# Patient Record
Sex: Male | Born: 1940 | Hispanic: No | Smoking: Never smoker
Health system: Southern US, Community
[De-identification: ages and names within clinical notes are randomized; demographics above are authoritative.]

## PROBLEM LIST (undated history)

## (undated) DIAGNOSIS — F0390 Unspecified dementia without behavioral disturbance: Secondary | ICD-10-CM

---

## 2011-04-10 ENCOUNTER — Other Ambulatory Visit: Payer: Self-pay | Admitting: Family Medicine

## 2011-04-10 DIAGNOSIS — G3184 Mild cognitive impairment, so stated: Secondary | ICD-10-CM

## 2012-08-05 ENCOUNTER — Other Ambulatory Visit: Payer: Self-pay | Admitting: Family Medicine

## 2012-08-05 DIAGNOSIS — F17200 Nicotine dependence, unspecified, uncomplicated: Secondary | ICD-10-CM

## 2012-08-17 ENCOUNTER — Other Ambulatory Visit: Payer: Self-pay | Admitting: Family Medicine

## 2012-08-17 DIAGNOSIS — R9389 Abnormal findings on diagnostic imaging of other specified body structures: Secondary | ICD-10-CM

## 2012-08-24 ENCOUNTER — Ambulatory Visit
Admission: RE | Admit: 2012-08-24 | Discharge: 2012-08-24 | Disposition: A | Discharge status: Home / Self Care | Payer: Medicare Other | Source: Ambulatory Visit | Attending: Family Medicine | Admitting: Family Medicine

## 2012-08-24 DIAGNOSIS — R9389 Abnormal findings on diagnostic imaging of other specified body structures: Secondary | ICD-10-CM

## 2014-05-26 ENCOUNTER — Other Ambulatory Visit: Payer: Self-pay | Admitting: Family Medicine

## 2014-05-26 DIAGNOSIS — G3184 Mild cognitive impairment, so stated: Secondary | ICD-10-CM

## 2014-06-03 ENCOUNTER — Ambulatory Visit
Admission: RE | Admit: 2014-06-03 | Discharge: 2014-06-03 | Disposition: A | Discharge status: Home / Self Care | Payer: Medicare Other | Source: Ambulatory Visit | Attending: Family Medicine | Admitting: Family Medicine

## 2014-06-03 DIAGNOSIS — G3184 Mild cognitive impairment, so stated: Secondary | ICD-10-CM

## 2016-05-27 ENCOUNTER — Emergency Department (HOSPITAL_COMMUNITY): Payer: Medicare Other

## 2016-05-27 ENCOUNTER — Encounter (HOSPITAL_COMMUNITY): Payer: Self-pay

## 2016-05-27 ENCOUNTER — Emergency Department (HOSPITAL_COMMUNITY)
Admission: EM | Admit: 2016-05-27 | Discharge: 2016-05-27 | Disposition: A | Discharge status: Home / Self Care | Payer: Medicare Other | Attending: Emergency Medicine | Admitting: Emergency Medicine

## 2016-05-27 DIAGNOSIS — D72829 Elevated white blood cell count, unspecified: Secondary | ICD-10-CM | POA: Diagnosis not present

## 2016-05-27 DIAGNOSIS — R42 Dizziness and giddiness: Secondary | ICD-10-CM | POA: Diagnosis present

## 2016-05-27 DIAGNOSIS — Z79899 Other long term (current) drug therapy: Secondary | ICD-10-CM | POA: Diagnosis not present

## 2016-05-27 HISTORY — DX: Unspecified dementia, unspecified severity, without behavioral disturbance, psychotic disturbance, mood disturbance, and anxiety: F03.90

## 2016-05-27 LAB — CBC WITH DIFFERENTIAL/PLATELET
BASOS ABS: 0 10*3/uL (ref 0.0–0.1)
BASOS PCT: 0 %
EOS ABS: 0 10*3/uL (ref 0.0–0.7)
Eosinophils Relative: 0 %
HCT: 46.3 % (ref 39.0–52.0)
Hemoglobin: 15.4 g/dL (ref 13.0–17.0)
Lymphocytes Relative: 8 %
Lymphs Abs: 1.7 10*3/uL (ref 0.7–4.0)
MCH: 30.9 pg (ref 26.0–34.0)
MCHC: 33.3 g/dL (ref 30.0–36.0)
MCV: 93 fL (ref 78.0–100.0)
MONO ABS: 1.9 10*3/uL — AB (ref 0.1–1.0)
MONOS PCT: 8 %
NEUTROS ABS: 19.1 10*3/uL — AB (ref 1.7–7.7)
Neutrophils Relative %: 84 %
PLATELETS: 284 10*3/uL (ref 150–400)
RBC: 4.98 MIL/uL (ref 4.22–5.81)
RDW: 12.6 % (ref 11.5–15.5)
WBC: 22.6 10*3/uL — ABNORMAL HIGH (ref 4.0–10.5)

## 2016-05-27 LAB — COMPREHENSIVE METABOLIC PANEL
ALBUMIN: 4 g/dL (ref 3.5–5.0)
ALT: 13 U/L — ABNORMAL LOW (ref 17–63)
ANION GAP: 8 (ref 5–15)
AST: 18 U/L (ref 15–41)
Alkaline Phosphatase: 58 U/L (ref 38–126)
BILIRUBIN TOTAL: 1.2 mg/dL (ref 0.3–1.2)
BUN: 19 mg/dL (ref 6–20)
CHLORIDE: 105 mmol/L (ref 101–111)
CO2: 25 mmol/L (ref 22–32)
Calcium: 9 mg/dL (ref 8.9–10.3)
Creatinine, Ser: 0.83 mg/dL (ref 0.61–1.24)
GFR calc Af Amer: >60 mL/min (ref >60)
GFR calc non Af Amer: >60 mL/min (ref >60)
GLUCOSE: 126 mg/dL — AB (ref 65–99)
POTASSIUM: 3.7 mmol/L (ref 3.5–5.1)
SODIUM: 138 mmol/L (ref 135–145)
TOTAL PROTEIN: 6.8 g/dL (ref 6.5–8.1)

## 2016-05-27 LAB — I-STAT CG4 LACTIC ACID, ED: Lactic Acid, Venous: 1.03 mmol/L (ref 0.5–1.9)

## 2016-05-27 NOTE — Discharge Instructions (Signed)
Leukocytosis Leukocytosis means you have more white blood cells than normal. White blood cells are made in your bone marrow. The main job of white blood cells is to fight infection. Having too many white blood cells is a common condition. It can develop as a result of many types of medical problems. CAUSES  In some cases, your bone marrow may be normal, but it is still making too many white blood cells. This could be the result of:  Infection.  Injury.  Physical stress.  Emotional stress.  Surgery.  Allergic reactions.  Tumors that do not start in the blood or bone marrow.  An inherited disease.  Certain medicines.  Pregnancy and labor. In other cases, you may have a bone marrow disorder that is causing your body to make too many white blood cells. Bone marrow disorders include:  Leukemia. This is a type of blood cancer.  Myeloproliferative disorders. These disorders cause blood cells to grow abnormally. SYMPTOMS  Some people have no symptoms. Others have symptoms due to the medical problem that is causing their leukocytosis. These symptoms may include:  Bleeding.  Bruising.  Fever.  Night sweats.  Repeated infections.  Weakness.  Weight loss. DIAGNOSIS  Leukocytosis is often found during blood tests that are done as part of a normal physical exam. Your caregiver will probably order other tests to help determine why you have too many white blood cells. These tests may include:  A complete blood count (CBC). This test measures all the types of blood cells in your body.  Chest X-rays, urine tests (urinalysis), or other tests to look for signs of infection.  Bone marrow aspiration. For this test, a needle is put into your bone. Cells from the bone marrow are removed through the needle. The cells are then examined under a microscope. TREATMENT  Treatment is usually not needed for leukocytosis. However, if a disorder is causing your leukocytosis, it will need to be  treated. Treatment may include:  Antibiotic medicines if you have a bacterial infection.  Bone marrow transplant. Your diseased bone marrow is replaced with healthy cells that will grow new bone marrow.  Chemotherapy. This is the use of drugs to kill cancer cells. HOME CARE INSTRUCTIONS  Only take over-the-counter or prescription medicines as directed by your caregiver.  Maintain a healthy weight. Ask your caregiver what weight is best for you.  Eat foods that are low in saturated fats and high in fiber. Eat plenty of fruits and vegetables.  Drink enough fluids to keep your urine clear or pale yellow.  Get 30 minutes of exercise at least 5 times a week. Check with your caregiver before starting a new exercise routine.  Limit caffeine and alcohol.  Do not smoke.  Keep all follow-up appointments as directed by your caregiver. SEEK MEDICAL CARE IF:  You feel weak or more tired than usual.  You develop chills, a cough, or nasal congestion.  You lose weight without trying.  You have night sweats.  You bruise easily. SEEK IMMEDIATE MEDICAL CARE IF:  You bleed more than normal.  You have chest pain.  You have trouble breathing.  You have a fever.  You have uncontrolled nausea or vomiting.  You feel dizzy or lightheaded. MAKE SURE YOU:  Understand these instructions.  Will watch your condition.  Will get help right away if you are not doing well or get worse.   This information is not intended to replace advice given to you by your health care provider.   Make sure you discuss any questions you have with your health care provider.   Document Released: 10/23/2011 Document Revised: 01/26/2012 Document Reviewed: 05/07/2015 Elsevier Interactive Patient Education 2016 Elsevier Inc.  

## 2016-05-27 NOTE — ED Notes (Signed)
Patient transported to CT 

## 2016-05-27 NOTE — Progress Notes (Signed)
Patient confirms his pcp is Dr. Sigmund HazelLisa Miller.  System updated.

## 2016-05-27 NOTE — ED Notes (Signed)
Pt sent from doctor to be evaluated for abnormal lab work. His WBC were elevated. Pt complains of dizziness, fatigue, decreased appetite

## 2016-05-27 NOTE — ED Notes (Addendum)
Patient transported to x-ray. ?

## 2016-05-27 NOTE — ED Notes (Signed)
Bed: [id number] Expected date:  Expected time:  Means of arrival:  Comments: Tr 8

## 2016-05-27 NOTE — ED Provider Notes (Signed)
CSN: [id number]     Arrival date & time 05/27/16  1937 History  By signing my name below, I, Jeremy Olson, attest that this documentation has been prepared under the direction and in the presence of TRW AutomotiveKelly Mikya Don, PA-C. Electronically Signed: Bridgette HabermannMaria Olson, ED Scribe. 05/27/2016. 9:24 PM.   Chief Complaint  Patient presents with  . Abnormal Lab   The history is provided by the patient and the spouse. No language interpreter was used.   HPI Comments: Jeremy AdamJames B Jeremy Olson Jr. is a 75 y.o. male with h/o vascular dementia who presents to the Emergency Department complaining of dizziness onset this morning. Pt also had associated fatigue and decreased appetite. Per pt's wife, the symptoms lasted all day, but have spontaneously improved. He took an Aleve which made him sleep for most of the day. [facility], where they ran some labs on him and his WBC were elevated so they sent him here. Wife states he has not eaten anything today. Pt has no h/o MI or CVA. Pt has a [facility]. Pt denies headache, fever, chest pain, abdominal pain, nausea, vomiting, unilateral weakness or numbness, and slurred speech. Pt states his symptoms have resolved since arriving to the ED.   Past Medical History  Diagnosis Date  . Dementia    History reviewed. No pertinent past surgical history. History reviewed. No pertinent family history. Social History  Substance Use Topics  . Smoking status: Never Smoker   . Smokeless tobacco: None  . Alcohol Use: No    Review of Systems  Constitutional: Positive for appetite change and fatigue. Negative for fever.  Cardiovascular: Negative for chest pain.  Gastrointestinal: Negative for nausea, vomiting and abdominal pain.  Neurological: Positive for dizziness. Negative for speech difficulty, weakness and headaches.    Allergies  Review of patient's allergies indicates no known allergies.  Home Medications   Prior to Admission  medications   Medication Sig Start Date End Date Taking? Authorizing Provider  donepezil (ARICEPT) 10 MG tablet Take 10 mg by mouth at bedtime.   Yes Historical Provider, MD   BP 117/68 mmHg  Pulse 63  Temp(Src) 99.2 F (37.3 C) (Oral)  Resp 16  Ht 6\' 3"  (1.905 m)  Wt 141 lb (63.957 kg)  BMI 17.62 kg/m2  SpO2 98%   Physical Exam  Constitutional: He is oriented to person, place, and time. He appears well-developed and well-nourished. No distress.  Nontoxic appearing and in no distress  HENT:  Head: Normocephalic and atraumatic.  Mouth/Throat: Oropharynx is clear and moist. No oropharyngeal exudate.  Symmetric rise of the uvula with phonation  Eyes: Conjunctivae and EOM are normal. Pupils are equal, round, and reactive to light. No scleral icterus.  Neck: Normal range of motion.  Cardiovascular: Normal rate, regular rhythm and intact distal pulses.   Pulmonary/Chest: Effort normal and breath sounds normal. No respiratory distress. He has no wheezes. He has no rales.  Lungs clear to auscultation bilaterally. Chest expansion symmetric.  Musculoskeletal: Normal range of motion.  Neurological: He is alert and oriented to person, place, and time. No cranial nerve deficit. He exhibits normal muscle tone. Coordination normal.  GCS 15. Speech is goal oriented. No cranial nerve deficits appreciated; symmetric eyebrow raise, no facial drooping, tongue midline. Patient is equal grip strength bilaterally with 5/5 strength against resistance in all major muscle groups bilaterally. Sensation to light touch intact. Patient moves extremities without ataxia. No pronator drift. He is  ambulatory independently with steady gait.  Skin: Skin is warm and dry. No rash noted. He is not diaphoretic. No erythema. No pallor.  Psychiatric: He has a normal mood and affect. His behavior is normal.  Nursing note and vitals reviewed.   ED Course  Procedures  DIAGNOSTIC STUDIES: Oxygen Saturation is 97% on RA,  adequate by my interpretation.    COORDINATION OF CARE: 9:24 PM Discussed treatment plan with pt at bedside which includes head CT and blood work and pt agreed to plan.  Labs Review Labs Reviewed  CBC WITH DIFFERENTIAL/PLATELET - Abnormal; Notable for the following:    WBC 22.6 (*)    Neutro Abs 19.1 (*)    Monocytes Absolute 1.9 (*)    All other components within normal limits  COMPREHENSIVE METABOLIC PANEL - Abnormal; Notable for the following:    Glucose, Bld 126 (*)    ALT 13 (*)    All other components within normal limits  I-STAT CG4 LACTIC ACID, ED    Imaging Review Dg Chest 2 View  05/27/2016  CLINICAL DATA:  Dizziness, leukocytosis.  Fatigue EXAM: CHEST  2 VIEW COMPARISON:  CT chest August 24, 2012 FINDINGS: Biapical scarring with hilar retraction suggests old granulomatous disease. No pleural effusion or focal consolidation. Cardiac silhouette is normal. No pneumothorax. Soft tissue planes and included osseous structures are nonsuspicious, mild degenerative change of thoracic spine. IMPRESSION: No acute cardiopulmonary process. Electronically Signed   By: Awilda Metro M.D.   On: 05/27/2016 22:05     I have personally reviewed and evaluated these images and lab results as part of my medical decision-making.   EKG Interpretation None      MDM   Final diagnoses:  Leukocytosis    75 year old male presents to the emergency department for evaluation of leukocytosis. He went to his primary care doctor's office this morning for complaints of dizziness, decreased appetite, and fatigue. Basic blood work was run which was significant for a leukocytosis of 24.8. The patient states that he feels fine. He states that he does not want to be here and that it is silly for him to be in the emergency department. He has a reassuring physical exam. No complaints of chest pain, shortness of breath, abdominal pain, or headache. Patient is afebrile.  Leukocytosis confirmed at 22.6.  Remainder laboratory workup is noncontributory. Patient had a urinalysis completed in the office which was negative for infection. Chest x-ray negative for pneumonia. Head CT also performed given complaints of dizziness and wife reporting some unsteady gait. CT negative for acute stroke. No hemorrhage, mass, or midline shift. Neurologic exam in the emergency department today is nonfocal.  Patient has been monitored for approximately 4 hours. He has had no decompensation in his mental status or any worsening of his gait. He states that he is no longer dizzy and he wants to go home. I do not see any indication for admission today. We have recommended close outpatient follow-up with his primary care doctor with return if symptoms worsen. Return precautions given at discharge. Patient and wife agreeable to plan with no unaddressed concerns; discharged in satisfactory condition.  I personally performed the services described in this documentation, which was scribed in my presence. The recorded information has been reviewed and is accurate.    Filed Vitals:   05/27/16 1939 05/27/16 2214 05/27/16 2331  BP: 158/134 117/68 102/59  Pulse: 93 63 71  Temp: 99.2 F (37.3 C)  98.5 F (36.9 C)  TempSrc: Oral  Oral  Resp:  16 17  Height:  6\' 3"  (1.905 m)   Weight:  63.957 kg   SpO2: 97% 98% 94%      Antony Madura, PA-C 05/28/16 1956  Benjiman Core, MD 05/29/16 [phone]

## 2018-05-17 IMAGING — CT CT HEAD W/O CM
3 of 4 series · 16 of 47 positions shown, 19 images · non-contrast
Comparison: MR brain 06/03/2014

CLINICAL DATA: Dizziness, altered mental status

EXAM:
CT HEAD WITHOUT CONTRAST
TECHNIQUE: Contiguous axial images were obtained from the base of the skull
through the vertex without intravenous contrast.

[Series 2: head w/o · axial · non-contrast · 0.42mm/px · z∈[-102,+18]mm · 10 of 30 slices shown, 13 images]
[im 3/30  brain]
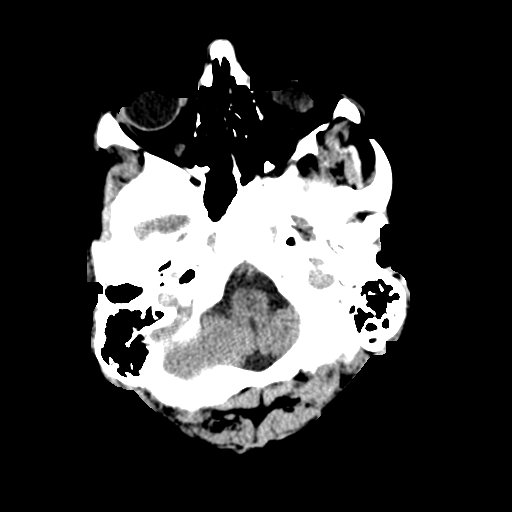
[im 3/30  bone]
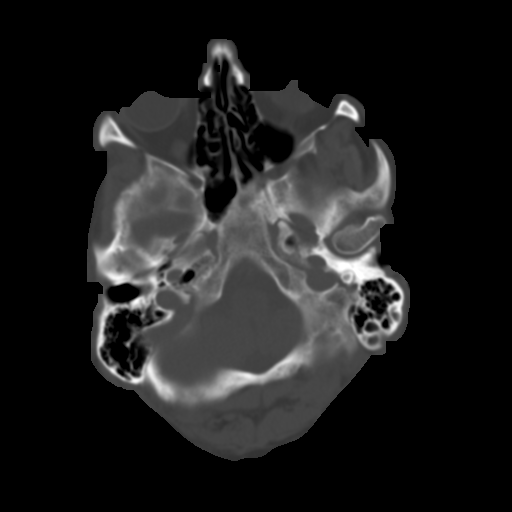
[im 5/30  brain]
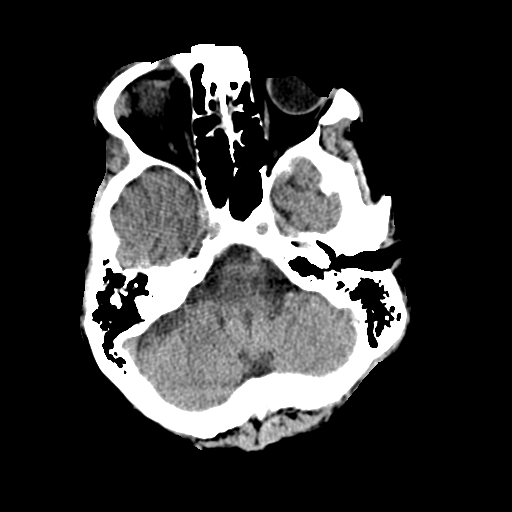
[im 9/30  brain]
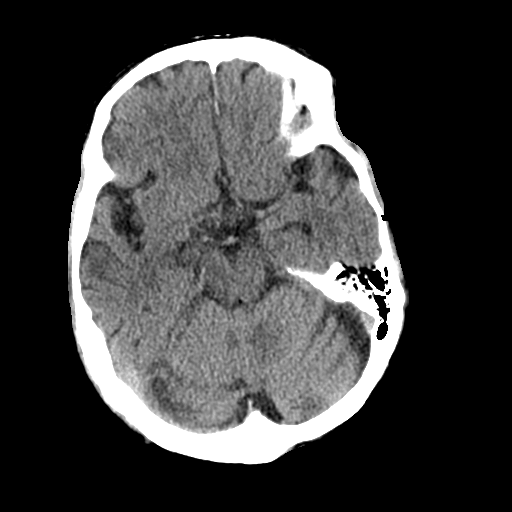
[im 11/30  brain]
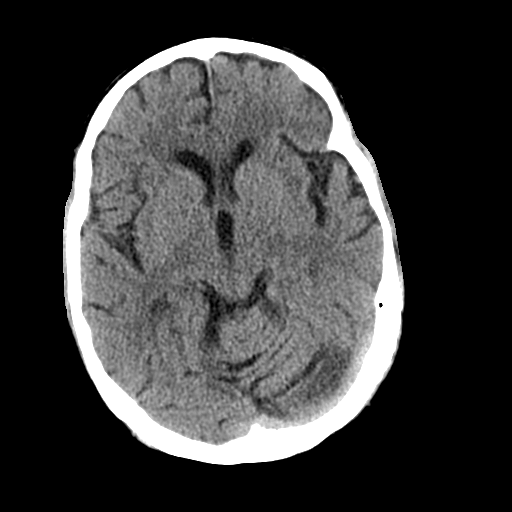
[im 13/30  brain]
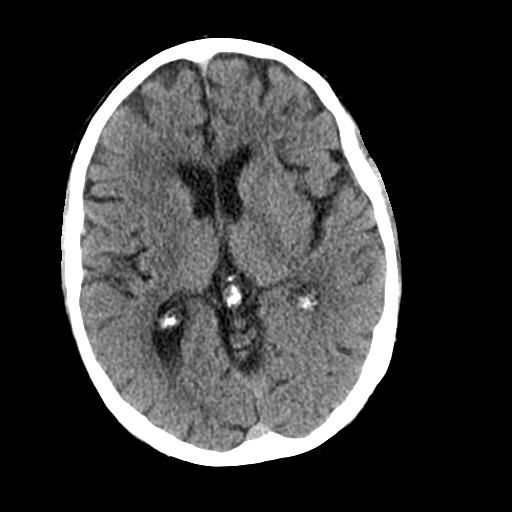
[im 13/30  bone]
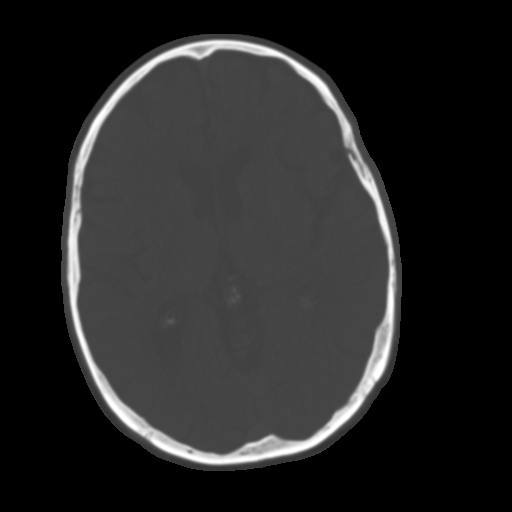
[im 17/30  brain]
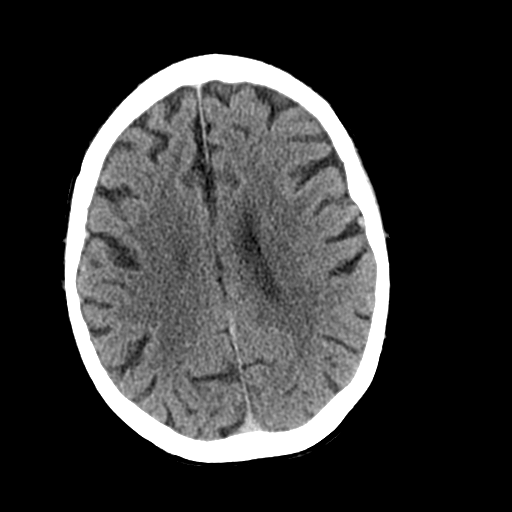
[im 19/30  brain]
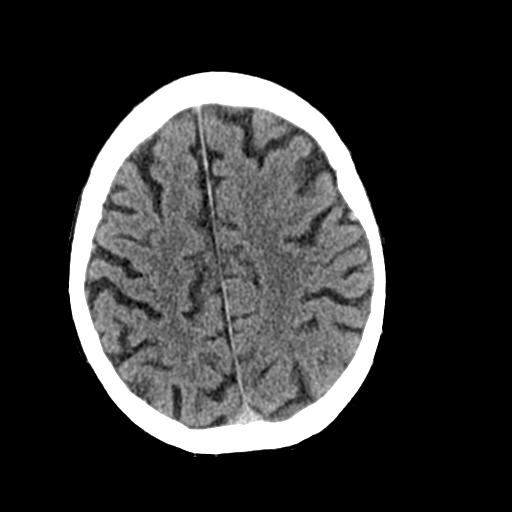
[im 21/30  brain]
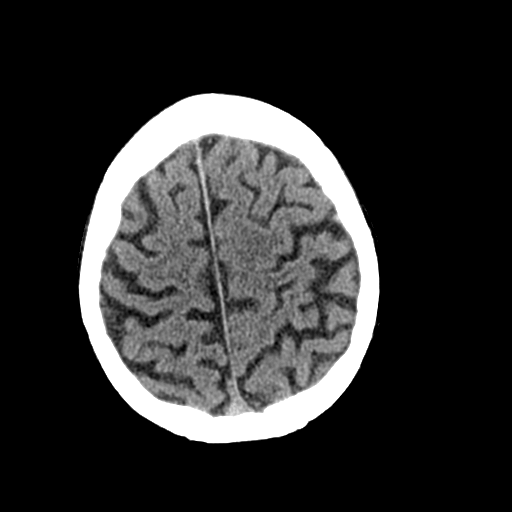
[im 25/30  brain]
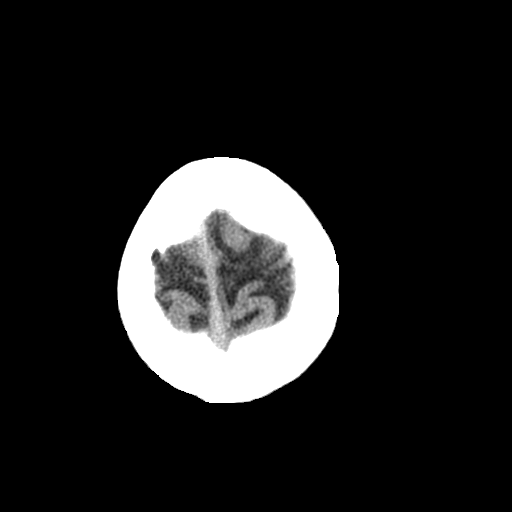
[im 25/30  bone]
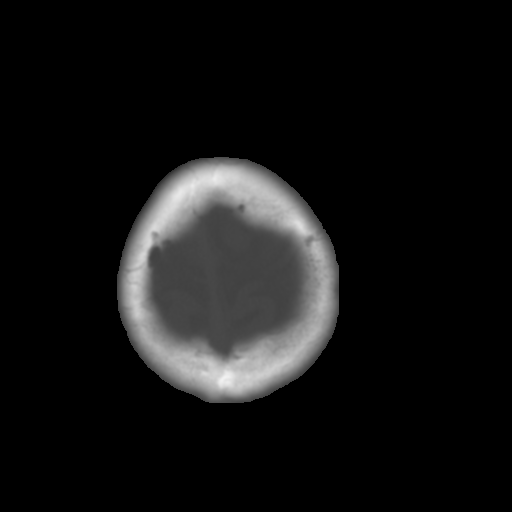
[im 27/30  brain]
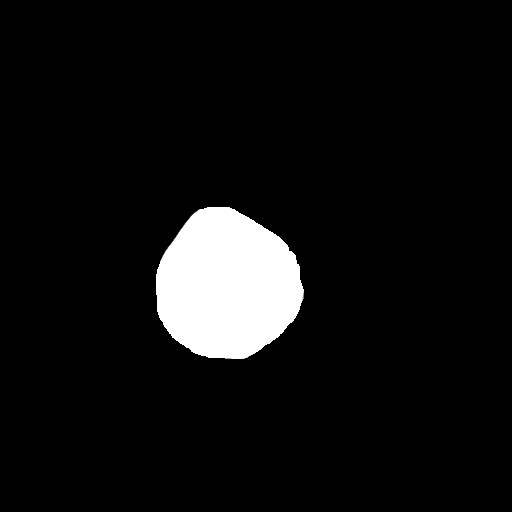

[Series 5: coronal · coronal · 0.29mm/px · 3 of 61 slices shown]
[im 21/61  brain]
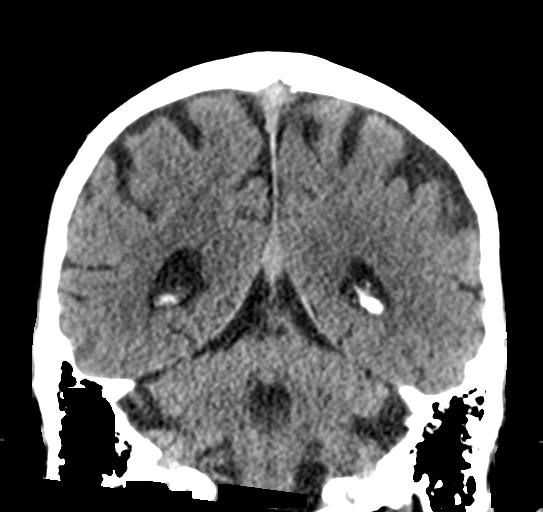
[im 27/61  brain]
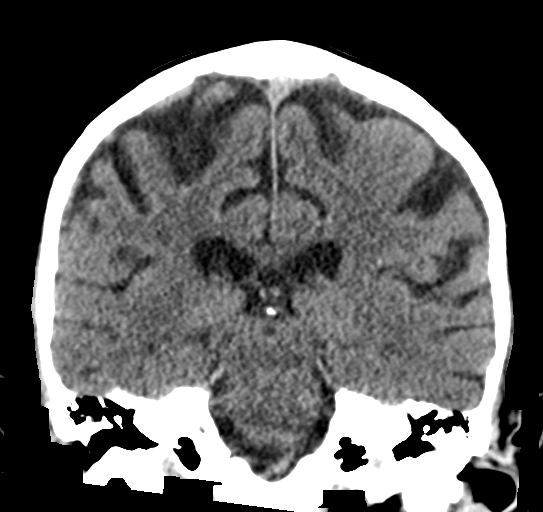
[im 34/61  brain]
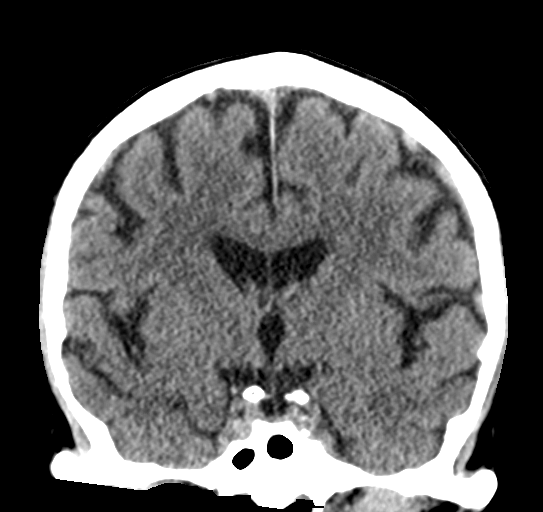

[Series 6: sagittal · sagittal · 0.29mm/px · 3 of 49 slices shown]
[im 17/49  brain]
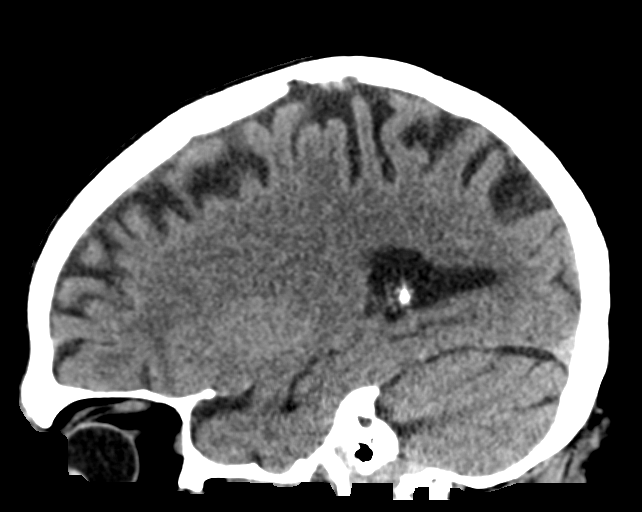
[im 25/49  brain]
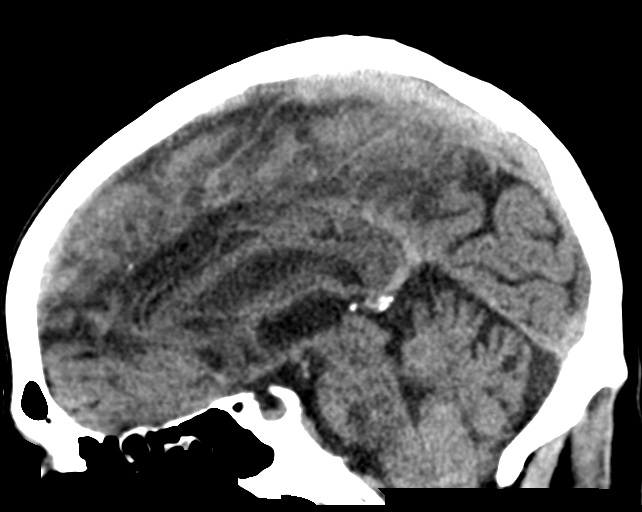
[im 33/49  brain]
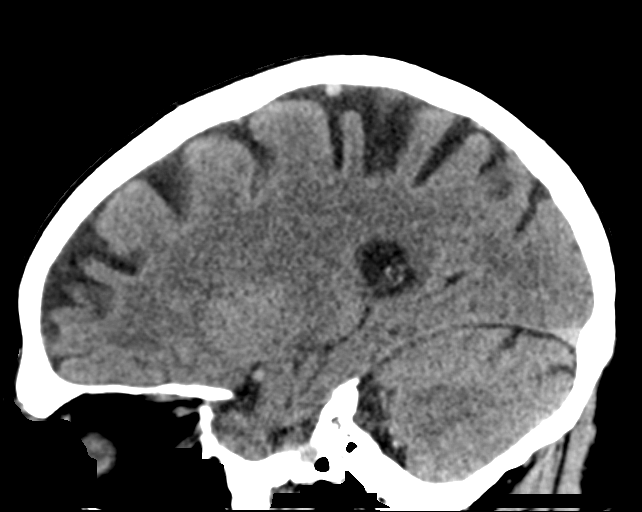

[16 of 47 positions shown; findings below may reference images not displayed]

FINDINGS: There is no evidence of mass effect, midline shift, or extra-axial
fluid collections. There is no evidence of a space-occupying lesion
or intracranial hemorrhage. There is no evidence of a cortical-based
area of acute infarction. There is generalized cerebral atrophy.
There is periventricular white matter low attenuation likely
secondary to microangiopathy.

The ventricles and sulci are appropriate for the patient's age. The
basal cisterns are patent.

Visualized portions of the orbits are unremarkable. The visualized
portions of the paranasal sinuses and mastoid air cells are
unremarkable.

The osseous structures are unremarkable.
IMPRESSION: No acute intracranial pathology.

## 2021-05-28 DIAGNOSIS — Z681 Body mass index (BMI) 19 or less, adult: Secondary | ICD-10-CM | POA: Diagnosis not present

## 2021-05-28 DIAGNOSIS — R636 Underweight: Secondary | ICD-10-CM | POA: Diagnosis not present

## 2021-05-28 DIAGNOSIS — F17210 Nicotine dependence, cigarettes, uncomplicated: Secondary | ICD-10-CM | POA: Diagnosis not present

## 2022-06-25 DIAGNOSIS — Z0000 Encounter for general adult medical examination without abnormal findings: Secondary | ICD-10-CM | POA: Diagnosis not present

## 2022-06-25 DIAGNOSIS — Z23 Encounter for immunization: Secondary | ICD-10-CM | POA: Diagnosis not present

## 2022-06-25 DIAGNOSIS — F17210 Nicotine dependence, cigarettes, uncomplicated: Secondary | ICD-10-CM | POA: Diagnosis not present

## 2022-11-01 DIAGNOSIS — R6889 Other general symptoms and signs: Secondary | ICD-10-CM | POA: Diagnosis not present

## 2022-11-04 DIAGNOSIS — R350 Frequency of micturition: Secondary | ICD-10-CM | POA: Diagnosis not present

## 2022-11-06 DIAGNOSIS — R350 Frequency of micturition: Secondary | ICD-10-CM | POA: Diagnosis not present

## 2022-11-07 ENCOUNTER — Other Ambulatory Visit: Payer: Self-pay

## 2022-11-07 ENCOUNTER — Emergency Department (HOSPITAL_COMMUNITY)
Admission: EM | Admit: 2022-11-07 | Discharge: 2022-11-09 | Disposition: A | Discharge status: Home / Self Care | Payer: Medicare Other | Attending: Emergency Medicine | Admitting: Emergency Medicine

## 2022-11-07 ENCOUNTER — Encounter (HOSPITAL_COMMUNITY): Payer: Self-pay

## 2022-11-07 DIAGNOSIS — F03918 Unspecified dementia, unspecified severity, with other behavioral disturbance: Secondary | ICD-10-CM | POA: Insufficient documentation

## 2022-11-07 DIAGNOSIS — R2689 Other abnormalities of gait and mobility: Secondary | ICD-10-CM | POA: Insufficient documentation

## 2022-11-07 DIAGNOSIS — R4689 Other symptoms and signs involving appearance and behavior: Secondary | ICD-10-CM | POA: Diagnosis present

## 2022-11-07 DIAGNOSIS — R799 Abnormal finding of blood chemistry, unspecified: Secondary | ICD-10-CM | POA: Insufficient documentation

## 2022-11-07 DIAGNOSIS — R9431 Abnormal electrocardiogram [ECG] [EKG]: Secondary | ICD-10-CM | POA: Diagnosis not present

## 2022-11-07 LAB — BASIC METABOLIC PANEL
Anion gap: 9 (ref 5–15)
BUN: 20 mg/dL (ref 8–23)
CO2: 25 mmol/L (ref 22–32)
Calcium: 9.4 mg/dL (ref 8.9–10.3)
Chloride: 106 mmol/L (ref 98–111)
Creatinine, Ser: 0.8 mg/dL (ref 0.61–1.24)
GFR, Estimated: >60 mL/min (ref >60)
Glucose, Bld: 133 mg/dL — ABNORMAL HIGH (ref 70–99)
Potassium: 3.6 mmol/L (ref 3.5–5.1)
Sodium: 140 mmol/L (ref 135–145)

## 2022-11-07 LAB — URINALYSIS, ROUTINE W REFLEX MICROSCOPIC
Bilirubin Urine: NEGATIVE
Glucose, UA: NEGATIVE mg/dL
Ketones, ur: NEGATIVE mg/dL
Leukocytes,Ua: NEGATIVE
Nitrite: NEGATIVE
Protein, ur: NEGATIVE mg/dL
Specific Gravity, Urine: >1.03 — ABNORMAL HIGH (ref 1.005–1.030)
pH: 6 (ref 5.0–8.0)

## 2022-11-07 LAB — CBG MONITORING, ED: Glucose-Capillary: 148 mg/dL — ABNORMAL HIGH (ref 70–99)

## 2022-11-07 LAB — CBC WITH DIFFERENTIAL/PLATELET
Abs Immature Granulocytes: 0.02 10*3/uL (ref 0.00–0.07)
Basophils Absolute: 0.1 10*3/uL (ref 0.0–0.1)
Basophils Relative: 1 %
Eosinophils Absolute: 0.2 10*3/uL (ref 0.0–0.5)
Eosinophils Relative: 2 %
HCT: 44.4 % (ref 39.0–52.0)
Hemoglobin: 14.6 g/dL (ref 13.0–17.0)
Immature Granulocytes: 0 %
Lymphocytes Relative: 12 %
Lymphs Abs: 1 10*3/uL (ref 0.7–4.0)
MCH: 31.3 pg (ref 26.0–34.0)
MCHC: 32.9 g/dL (ref 30.0–36.0)
MCV: 95.3 fL (ref 80.0–100.0)
Monocytes Absolute: 0.7 10*3/uL (ref 0.1–1.0)
Monocytes Relative: 9 %
Neutro Abs: 6.4 10*3/uL (ref 1.7–7.7)
Neutrophils Relative %: 76 %
Platelets: UNDETERMINED 10*3/uL (ref 150–400)
RBC: 4.66 MIL/uL (ref 4.22–5.81)
RDW: 12.7 % (ref 11.5–15.5)
WBC: 8.4 10*3/uL (ref 4.0–10.5)
nRBC: 0 % (ref 0.0–0.2)

## 2022-11-07 LAB — URINALYSIS, MICROSCOPIC (REFLEX)
Bacteria, UA: NONE SEEN
Squamous Epithelial / LPF: NONE SEEN (ref 0–5)

## 2022-11-07 NOTE — ED Provider Triage Note (Signed)
Emergency Medicine Provider Triage Evaluation Note  Jeremy Olson. , a 81 y.o. male  was evaluated in triage.  Patient was brought in by his wife and her sister with concerns that patient needs placement in a long-term care facility and also with concerns of a UTI.  Patient has a longstanding history of dementia, but per sister-in-law patient has been worsening over the last 4 weeks and needs to be placed in a facility because she does not feel that her sister can take care of him anymore at home.  Patient has no subjective complaints.  Review of Systems  Positive: As above Negative: As above  Physical Exam  BP (!) 140/65 (BP Location: Right Arm)   Pulse 78   Temp 98.1 F (36.7 C) (Oral)   Resp 16   SpO2 99%  Gen:   Awake, no distress, A&Ox2, not oriented to time Resp:  Normal effort  MSK:   Moves extremities without difficulty  Other:    Medical Decision Making  Medically screening exam initiated at 1:39 PM.  Appropriate orders placed.  Angela Adam. was informed that the remainder of the evaluation will be completed by another provider, this initial triage assessment does not replace that evaluation, and the importance of remaining in the ED until their evaluation is complete.     Jeremy Olson, [place] 11/07/22 [phone]

## 2022-11-07 NOTE — ED Triage Notes (Signed)
Pt was BIB his wife and her sister. Pt has dementia, but per the sister in law pt needs to be placed in a facility because she does not feel like her sister can take care of him anymore because she is legally blind and cannot rest because he is up and down and coming in and out.

## 2022-11-08 DIAGNOSIS — R531 Weakness: Secondary | ICD-10-CM | POA: Diagnosis not present

## 2022-11-08 DIAGNOSIS — Z743 Need for continuous supervision: Secondary | ICD-10-CM | POA: Diagnosis not present

## 2022-11-08 MED ORDER — DONEPEZIL HCL 10 MG PO TABS: 10.0000 mg | ORAL_TABLET | Freq: Every day | ORAL | Status: DC

## 2022-11-08 MED ORDER — HALOPERIDOL LACTATE 5 MG/ML IJ SOLN
2.0000 mg | Freq: Once | INTRAMUSCULAR | Status: AC
  Administered 2022-11-08: 2 mg via INTRAMUSCULAR
  Filled 2022-11-08: qty 1

## 2022-11-08 MED ORDER — HALOPERIDOL 1 MG PO TABS: 2.0000 mg | ORAL_TABLET | Freq: Once | ORAL | Status: DC

## 2022-11-08 NOTE — Progress Notes (Signed)
Wife does not own a phone nor does she drive requested we call the sister Harriett Sine at [phone] The sister will relay the message to the patient's wife as she reports only having a pad to talk on.

## 2022-11-08 NOTE — Progress Notes (Signed)
Per nurse the family is refusing to come get the patient, states they are out of town and can not take care of the patient. AT this time CSW can not send the patient out due to PT denying rehab. CSW explained this to the family, CSW explained that the patient would need to stay here while the process of PT etc, however at this time PT recommends ALF and or home care which was set up.

## 2022-11-08 NOTE — ED Notes (Signed)
Ptar called 

## 2022-11-08 NOTE — Care Management (Addendum)
Patient was seen by PT and does not have a skillable need for SNF. Likely requires memory care. PCP will need to work with family on memory care. We have messaged the MD for Home health orders and CSW is working on that. Attempted to call number for contact,  no answer. Messaged with team ( MD, CSW, RN) regarding family issues, according to RN, the family is  refusing to pick up,   they claim they were told "he could stay there a few days"will make a referral to [facility] for assistance as well. At this time will remain in the ED boarding until family will pick him up,

## 2022-11-08 NOTE — ED Provider Notes (Signed)
[facility] EMERGENCY DEPARTMENT Provider Note   CSN: [id number] Arrival date & time: 11/07/22  1227     History  Chief Complaint  Patient presents with   facility placement    Dementia    Jeremy Olson. is a 81 y.o. male.  HPI Patient presents for behavioral concerns.  Medical history includes dementia.  Patient lives at home with his wife.  His wife has legal blindness and mobility.  Patient has a long history of with dementia and has had increasing behavioral concerns over the past several weeks.  This includes getting up throughout the night, opening and unlocking doors, leaving [place] surrounding.  Wife's sister is currently in town visiting.  Sister is concerned that wife is no longer able to care for the patient at home.  For this reason, they present to the ED.  Wife reports that she has had discussions with patient's primary care doctor, Dr. Hyacinth Meeker, who advised him to come to the ER.  Patient himself has no complaints.    Home Medications Prior to Admission medications   Medication Sig Start Date End Date Taking? Authorizing Provider  donepezil (ARICEPT) 10 MG tablet Take 10 mg by mouth at bedtime.    [provider]      Allergies    Patient has no known allergies.    Review of Systems   Review of Systems  Unable to perform ROS: Dementia    Physical Exam Updated Vital Signs BP (!) 147/116   Pulse 63   Temp (!) 97.5 F (36.4 C) (Oral)   Resp 17   SpO2 100%  Physical Exam Vitals and nursing note reviewed.  Constitutional:      General: He is not in acute distress.    Appearance: Normal appearance. He is well-developed. He is not ill-appearing, toxic-appearing or diaphoretic.  HENT:     Head: Normocephalic and atraumatic.     Right Ear: External ear normal.     Left Ear: External ear normal.     Nose: Nose normal.  Eyes:     Extraocular Movements: Extraocular movements intact.     Conjunctiva/sclera: Conjunctivae  normal.  Cardiovascular:     Rate and Rhythm: Normal rate and regular rhythm.     Heart sounds: No murmur heard. Pulmonary:     Effort: Pulmonary effort is normal. No respiratory distress.  Abdominal:     General: There is no distension.     Palpations: Abdomen is soft.     Tenderness: There is no abdominal tenderness.  Musculoskeletal:        General: No swelling. Normal range of motion.     Cervical back: Normal range of motion and neck supple.     Right lower leg: No edema.     Left lower leg: No edema.  Skin:    General: Skin is warm and dry.     Capillary Refill: Capillary refill takes less than 2 seconds.     Coloration: Skin is not jaundiced or pale.  Neurological:     General: No focal deficit present.     Mental Status: He is alert. Mental status is at baseline. He is disoriented.  Psychiatric:        Mood and Affect: Mood normal.        Behavior: Behavior normal.     ED Results / Procedures / Treatments   Labs (all labs ordered are listed, but only abnormal results are displayed) Labs Reviewed  URINALYSIS, ROUTINE W  REFLEX MICROSCOPIC - Abnormal; Notable for the following components:      Result Value   Specific Gravity, Urine >1.030 (*)    Hgb urine dipstick TRACE (*)    All other components within normal limits  BASIC METABOLIC PANEL - Abnormal; Notable for the following components:   Glucose, Bld 133 (*)    All other components within normal limits  CBG MONITORING, ED - Abnormal; Notable for the following components:   Glucose-Capillary 148 (*)    All other components within normal limits  CBC WITH DIFFERENTIAL/PLATELET  URINALYSIS, MICROSCOPIC (REFLEX)    EKG None  Radiology No results found.  Procedures Procedures    Medications Ordered in ED Medications - No data to display  ED Course/ Medical Decision Making/ A&P                           Medical Decision Making  Patient is a 81 year old male with history of dementia, presenting to the  ED with his wife and sister-in-law due to behavioral concerns at home.  He has a long history of dementia and has had increasing concerning behaviors at home for the past several weeks.  His wife does not feel that she can care for him at home anymore.  At home, it is similar his wife only.  They have no home health support.  Wife has legal blindness and poor mobility at baseline.  Prior to being bedded in the ED, workup was initiated.  Although patient has had urinary frequency, there is no evidence of UTI.  Serum lab work is reassuring.  Patient is well-appearing on exam.  He has no difficulty with ambulation.  He is calm and cooperative.  He has no complaints.  Patient is medically cleared.  TOC consult was placed.        Final Clinical Impression(s) / ED Diagnoses Final diagnoses:  Dementia with behavioral disturbance [facility])    Rx / DC Orders ED Discharge Orders     None         Godfrey Pick, MD 11/08/22 [phone]

## 2022-11-08 NOTE — Progress Notes (Signed)
Patient will have to go home, as the patient will not qualify for SNF. The patient will need ALF and HH-AIDS to assist the family.

## 2022-11-08 NOTE — Progress Notes (Signed)
CSW tried to call the sister as the wife as no phone. Left a VM/ and text message.

## 2022-11-08 NOTE — Progress Notes (Signed)
Transition of Care [facility]) - Emergency Department Mini Assessment   Patient Details  Name: Jeremy Olson. MRN: [id number] Date of Birth: 1941/01/01  [facility] CM/SW Contact:    Rodney Booze, LCSW Phone Number: 11/08/2022, 9:58 AM   Clinical Narrative:  CSW met with the family at the bedside. The wife is very concerned as the patient's mental state is getting worse. CSW observed the patient being unaware and trying to get up out the bed. The patient kept asking what was attached to him as the patient continued to get up the CSW called on the MD DIXON and the Nurse Alana, The wife stated that she just wants the husband to be safe. CSW has requested a PT order, however explained to the family that this patient will really need LTC memory care. The family was advised to also be looking into outside resources in case of the patient not being placed. The patient is confused and was given medication to calm down.   ED Mini Assessment: What brought you to the Emergency Department? : (P) Patient's wife reports that the husband has been out of control walking and falling. Pateint needs LTC           Interventions which prevented an admission or readmission: (P) SNF Placement    Patient Contact and Communications     Spoke with: (P) Wife and sister. Contact Date: (P) 11/08/22,          Patient states their goals for this hospitalization and ongoing recovery are:: (P) Patients wife wants him to be safe patient is not aware of what is going on.      Admission diagnosis:  Home Placement; Dementia There are no problems to display for this patient.  PCP:  Kathyrn Lass, MD Pharmacy:   [id number] [place]Phone: [phone]: [phone]

## 2022-11-08 NOTE — Evaluation (Signed)
Physical Therapy Evaluation/ Discharge Patient Details Name: Jeremy Olson. MRN: [id number] DOB: 1941-11-01 Today's Date: 11/08/2022  History of Present Illness  81 yo presented to ED 12/22 due to increasing confusion, behavioral disturbance and falls with family unable to care for him at home. PMHx: dementia  Clinical Impression  Pt very pleasantly confused thinking he was arrested due to wrist restraints. Pt oriented to self and city but not to time, place or situation. Pt lives with wife who was not present on eval. Pt reports no AD use at baseline and able to walk and move without assist. Pt walking hallway without AD with guarding and followed all commands provided. Pt needs 24hr supervision for safety and would benefit from ALF memory are if family are unable to provide assist at home. Pt without gross mobility deficits who does not require further therapy intervention, will sign off. Encouraged daily mobility acutely and walking with staff.        Recommendations for follow up therapy are one component of a multi-disciplinary discharge planning process, led by the attending physician.  Recommendations may be updated based on patient status, additional functional criteria and insurance authorization.  Follow Up Recommendations Other (comment) (ALF memory care if family unable to provide assist at home)      Assistance Recommended at Discharge Frequent or constant Supervision/Assistance  Patient can return home with the following  A little help with walking and/or transfers;Assistance with cooking/housework;Direct supervision/assist for medications management;Assist for transportation;Direct supervision/assist for financial management;A little help with bathing/dressing/bathroom    Equipment Recommendations None recommended by PT  Recommendations for Other Services       Functional Status Assessment Patient has not had a recent decline in their functional status      Precautions / Restrictions Precautions Precautions: Fall      Mobility  Bed Mobility Overal bed mobility: Modified Independent                  Transfers Overall transfer level: Needs assistance   Transfers: Sit to/from Stand Sit to Stand: Supervision           General transfer comment: supervision for safety    Ambulation/Gait Ambulation/Gait assistance: Min guard Gait Distance (Feet): 300 Feet Assistive device: None Gait Pattern/deviations: WFL(Within Functional Limits)   Gait velocity interpretation: >2.62 ft/sec, indicative of community ambulatory   General Gait Details: guarding for safety with cues for direction  Stairs            Wheelchair Mobility    Modified Rankin (Stroke Patients Only)       Balance Overall balance assessment: Mild deficits observed, not formally tested                                           Pertinent Vitals/Pain Pain Assessment Pain Assessment: No/denies pain    Home Living Family/patient expects to be discharged to:: Private residence Living Arrangements: Spouse/significant other Available Help at Discharge: Family;Available 24 hours/day Type of Home: House Home Access: Stairs to enter   [employer] of Steps: 3   Home Layout: One level Home Equipment: None      Prior Function Prior Level of Function : Needs assist               ADLs Comments: wife does the IADLs     Hand Dominance  Extremity/Trunk Assessment   Upper Extremity Assessment Upper Extremity Assessment: Overall WFL for tasks assessed    Lower Extremity Assessment Lower Extremity Assessment: Overall WFL for tasks assessed    Cervical / Trunk Assessment Cervical / Trunk Assessment: Normal  Communication   Communication: No difficulties  Cognition Arousal/Alertness: Awake/alert Behavior During Therapy: WFL for tasks assessed/performed Overall Cognitive Status: Impaired/Different from  baseline Area of Impairment: Orientation, Memory, Following commands                 Orientation Level: Disoriented to, Time, Situation, Place   Memory: Decreased short-term memory Following Commands: Follows one step commands consistently       General Comments: pt with hx of dementia stating time as "Jan" "2009" and pt thinking he has been arrested due to wrist restraints. Pt followed all cues and commands without delay. No family present to state PLOF        General Comments      Exercises     Assessment/Plan    PT Assessment Patient does not need any further PT services  PT Problem List         PT Treatment Interventions      PT Goals (Current goals can be found in the Care Plan section)  Acute Rehab PT Goals PT Goal Formulation: Patient unable to participate in goal setting    Frequency       Co-evaluation               AM-PAC PT "6 Clicks" Mobility  Outcome Measure Help needed turning from your back to your side while in a flat bed without using bedrails?: None Help needed moving from lying on your back to sitting on the side of a flat bed without using bedrails?: None Help needed moving to and from a bed to a chair (including a wheelchair)?: A Little Help needed standing up from a chair using your arms (e.g., wheelchair or bedside chair)?: A Little Help needed to walk in hospital room?: A Little Help needed climbing 3-5 steps with a railing? : A Little 6 Click Score: 20    End of Session Equipment Utilized During Treatment: Gait belt Activity Tolerance: Patient tolerated treatment well Patient left: in bed;with restraints reapplied Nurse Communication: Mobility status PT Visit Diagnosis: Other abnormalities of gait and mobility (R26.89)    Time: 9373-4287 PT Time Calculation (min) (ACUTE ONLY): 13 min   Charges:   PT Evaluation $PT Eval Low Complexity: 1 Low          Sydnie Sigmund P, PT Acute Rehabilitation Services Office:  [phone]   Enedina Finner Jaleen Grupp 11/08/2022, 1:27 PM

## 2022-11-08 NOTE — Discharge Instructions (Addendum)
Work with Jeremy Olson' primary care doctor for placement in a memory care unit.

## 2022-11-08 NOTE — Social Work (Signed)
CSW set up home health with [facility].... The family has been called and Texted several times. This CSW gave the nurse the sisters number to continue to try to get the family to come get the patient.

## 2022-11-23 DIAGNOSIS — R350 Frequency of micturition: Secondary | ICD-10-CM | POA: Diagnosis not present

## 2022-11-23 DIAGNOSIS — F03911 Unspecified dementia, unspecified severity, with agitation: Secondary | ICD-10-CM | POA: Diagnosis not present

## 2022-11-23 DIAGNOSIS — Z9181 History of falling: Secondary | ICD-10-CM | POA: Diagnosis not present

## 2022-11-23 DIAGNOSIS — Z87891 Personal history of nicotine dependence: Secondary | ICD-10-CM | POA: Diagnosis not present

## 2022-11-25 DIAGNOSIS — R350 Frequency of micturition: Secondary | ICD-10-CM | POA: Diagnosis not present

## 2022-11-25 DIAGNOSIS — Z9181 History of falling: Secondary | ICD-10-CM | POA: Diagnosis not present

## 2022-11-25 DIAGNOSIS — Z87891 Personal history of nicotine dependence: Secondary | ICD-10-CM | POA: Diagnosis not present

## 2022-11-25 DIAGNOSIS — F03911 Unspecified dementia, unspecified severity, with agitation: Secondary | ICD-10-CM | POA: Diagnosis not present

## 2022-11-26 DIAGNOSIS — Z87891 Personal history of nicotine dependence: Secondary | ICD-10-CM | POA: Diagnosis not present

## 2022-11-26 DIAGNOSIS — F03911 Unspecified dementia, unspecified severity, with agitation: Secondary | ICD-10-CM | POA: Diagnosis not present

## 2022-11-26 DIAGNOSIS — Z9181 History of falling: Secondary | ICD-10-CM | POA: Diagnosis not present

## 2022-11-26 DIAGNOSIS — R350 Frequency of micturition: Secondary | ICD-10-CM | POA: Diagnosis not present

## 2022-12-04 DIAGNOSIS — R350 Frequency of micturition: Secondary | ICD-10-CM | POA: Diagnosis not present

## 2022-12-04 DIAGNOSIS — Z87891 Personal history of nicotine dependence: Secondary | ICD-10-CM | POA: Diagnosis not present

## 2022-12-04 DIAGNOSIS — F03911 Unspecified dementia, unspecified severity, with agitation: Secondary | ICD-10-CM | POA: Diagnosis not present

## 2022-12-04 DIAGNOSIS — Z9181 History of falling: Secondary | ICD-10-CM | POA: Diagnosis not present

## 2022-12-10 DIAGNOSIS — Z87891 Personal history of nicotine dependence: Secondary | ICD-10-CM | POA: Diagnosis not present

## 2022-12-10 DIAGNOSIS — R634 Abnormal weight loss: Secondary | ICD-10-CM | POA: Diagnosis not present

## 2022-12-10 DIAGNOSIS — Z23 Encounter for immunization: Secondary | ICD-10-CM | POA: Diagnosis not present

## 2022-12-24 DIAGNOSIS — M159 Polyosteoarthritis, unspecified: Secondary | ICD-10-CM | POA: Diagnosis not present

## 2022-12-24 DIAGNOSIS — Z8639 Personal history of other endocrine, nutritional and metabolic disease: Secondary | ICD-10-CM | POA: Diagnosis not present

## 2022-12-24 DIAGNOSIS — Z713 Dietary counseling and surveillance: Secondary | ICD-10-CM | POA: Diagnosis not present

## 2022-12-26 DIAGNOSIS — F0153 Vascular dementia, unspecified severity, with mood disturbance: Secondary | ICD-10-CM | POA: Diagnosis not present

## 2022-12-26 DIAGNOSIS — M159 Polyosteoarthritis, unspecified: Secondary | ICD-10-CM | POA: Diagnosis not present

## 2022-12-29 DIAGNOSIS — R21 Rash and other nonspecific skin eruption: Secondary | ICD-10-CM | POA: Diagnosis not present

## 2023-01-01 DIAGNOSIS — M159 Polyosteoarthritis, unspecified: Secondary | ICD-10-CM | POA: Diagnosis not present

## 2023-01-01 DIAGNOSIS — F0153 Vascular dementia, unspecified severity, with mood disturbance: Secondary | ICD-10-CM | POA: Diagnosis not present

## 2023-01-07 DIAGNOSIS — H1032 Unspecified acute conjunctivitis, left eye: Secondary | ICD-10-CM | POA: Diagnosis not present

## 2023-01-09 DIAGNOSIS — F0153 Vascular dementia, unspecified severity, with mood disturbance: Secondary | ICD-10-CM | POA: Diagnosis not present

## 2023-01-09 DIAGNOSIS — M159 Polyosteoarthritis, unspecified: Secondary | ICD-10-CM | POA: Diagnosis not present

## 2023-01-12 DIAGNOSIS — M159 Polyosteoarthritis, unspecified: Secondary | ICD-10-CM | POA: Diagnosis not present

## 2023-01-12 DIAGNOSIS — F0153 Vascular dementia, unspecified severity, with mood disturbance: Secondary | ICD-10-CM | POA: Diagnosis not present

## 2023-01-13 DIAGNOSIS — B351 Tinea unguium: Secondary | ICD-10-CM | POA: Diagnosis not present

## 2023-01-13 DIAGNOSIS — M79674 Pain in right toe(s): Secondary | ICD-10-CM | POA: Diagnosis not present

## 2023-01-19 DIAGNOSIS — M159 Polyosteoarthritis, unspecified: Secondary | ICD-10-CM | POA: Diagnosis not present

## 2023-01-19 DIAGNOSIS — F0153 Vascular dementia, unspecified severity, with mood disturbance: Secondary | ICD-10-CM | POA: Diagnosis not present

## 2023-01-20 DIAGNOSIS — M160 Bilateral primary osteoarthritis of hip: Secondary | ICD-10-CM | POA: Diagnosis not present

## 2023-01-20 DIAGNOSIS — G301 Alzheimer's disease with late onset: Secondary | ICD-10-CM | POA: Diagnosis not present

## 2023-01-20 DIAGNOSIS — F02818 Dementia in other diseases classified elsewhere, unspecified severity, with other behavioral disturbance: Secondary | ICD-10-CM | POA: Diagnosis not present

## 2023-01-21 DIAGNOSIS — M159 Polyosteoarthritis, unspecified: Secondary | ICD-10-CM | POA: Diagnosis not present

## 2023-01-21 DIAGNOSIS — F0153 Vascular dementia, unspecified severity, with mood disturbance: Secondary | ICD-10-CM | POA: Diagnosis not present

## 2023-01-26 DIAGNOSIS — F0153 Vascular dementia, unspecified severity, with mood disturbance: Secondary | ICD-10-CM | POA: Diagnosis not present

## 2023-01-26 DIAGNOSIS — M159 Polyosteoarthritis, unspecified: Secondary | ICD-10-CM | POA: Diagnosis not present

## 2023-01-27 DIAGNOSIS — M160 Bilateral primary osteoarthritis of hip: Secondary | ICD-10-CM | POA: Diagnosis not present

## 2023-01-27 DIAGNOSIS — G301 Alzheimer's disease with late onset: Secondary | ICD-10-CM | POA: Diagnosis not present

## 2023-01-29 DIAGNOSIS — M159 Polyosteoarthritis, unspecified: Secondary | ICD-10-CM | POA: Diagnosis not present

## 2023-01-29 DIAGNOSIS — F0153 Vascular dementia, unspecified severity, with mood disturbance: Secondary | ICD-10-CM | POA: Diagnosis not present

## 2023-02-03 DIAGNOSIS — F0153 Vascular dementia, unspecified severity, with mood disturbance: Secondary | ICD-10-CM | POA: Diagnosis not present

## 2023-02-03 DIAGNOSIS — M159 Polyosteoarthritis, unspecified: Secondary | ICD-10-CM | POA: Diagnosis not present

## 2023-02-05 DIAGNOSIS — F0153 Vascular dementia, unspecified severity, with mood disturbance: Secondary | ICD-10-CM | POA: Diagnosis not present

## 2023-02-05 DIAGNOSIS — M159 Polyosteoarthritis, unspecified: Secondary | ICD-10-CM | POA: Diagnosis not present

## 2023-02-09 DIAGNOSIS — M159 Polyosteoarthritis, unspecified: Secondary | ICD-10-CM | POA: Diagnosis not present

## 2023-02-09 DIAGNOSIS — F0153 Vascular dementia, unspecified severity, with mood disturbance: Secondary | ICD-10-CM | POA: Diagnosis not present

## 2023-02-13 DIAGNOSIS — M159 Polyosteoarthritis, unspecified: Secondary | ICD-10-CM | POA: Diagnosis not present

## 2023-02-13 DIAGNOSIS — F0153 Vascular dementia, unspecified severity, with mood disturbance: Secondary | ICD-10-CM | POA: Diagnosis not present

## 2023-02-20 DIAGNOSIS — M159 Polyosteoarthritis, unspecified: Secondary | ICD-10-CM | POA: Diagnosis not present

## 2023-02-20 DIAGNOSIS — F0153 Vascular dementia, unspecified severity, with mood disturbance: Secondary | ICD-10-CM | POA: Diagnosis not present

## 2023-02-24 DIAGNOSIS — F0153 Vascular dementia, unspecified severity, with mood disturbance: Secondary | ICD-10-CM | POA: Diagnosis not present

## 2023-02-24 DIAGNOSIS — M159 Polyosteoarthritis, unspecified: Secondary | ICD-10-CM | POA: Diagnosis not present

## 2023-02-25 DIAGNOSIS — M159 Polyosteoarthritis, unspecified: Secondary | ICD-10-CM | POA: Diagnosis not present

## 2023-03-02 DIAGNOSIS — F0153 Vascular dementia, unspecified severity, with mood disturbance: Secondary | ICD-10-CM | POA: Diagnosis not present

## 2023-03-02 DIAGNOSIS — M159 Polyosteoarthritis, unspecified: Secondary | ICD-10-CM | POA: Diagnosis not present

## 2023-03-04 DIAGNOSIS — F0153 Vascular dementia, unspecified severity, with mood disturbance: Secondary | ICD-10-CM | POA: Diagnosis not present

## 2023-03-04 DIAGNOSIS — M159 Polyosteoarthritis, unspecified: Secondary | ICD-10-CM | POA: Diagnosis not present

## 2023-03-05 DIAGNOSIS — M159 Polyosteoarthritis, unspecified: Secondary | ICD-10-CM | POA: Diagnosis not present

## 2023-03-05 DIAGNOSIS — F0153 Vascular dementia, unspecified severity, with mood disturbance: Secondary | ICD-10-CM | POA: Diagnosis not present

## 2023-03-09 DIAGNOSIS — M159 Polyosteoarthritis, unspecified: Secondary | ICD-10-CM | POA: Diagnosis not present

## 2023-03-09 DIAGNOSIS — F0153 Vascular dementia, unspecified severity, with mood disturbance: Secondary | ICD-10-CM | POA: Diagnosis not present

## 2023-03-13 DIAGNOSIS — F0153 Vascular dementia, unspecified severity, with mood disturbance: Secondary | ICD-10-CM | POA: Diagnosis not present

## 2023-03-13 DIAGNOSIS — M159 Polyosteoarthritis, unspecified: Secondary | ICD-10-CM | POA: Diagnosis not present

## 2023-03-17 DIAGNOSIS — M159 Polyosteoarthritis, unspecified: Secondary | ICD-10-CM | POA: Diagnosis not present

## 2023-03-17 DIAGNOSIS — F0153 Vascular dementia, unspecified severity, with mood disturbance: Secondary | ICD-10-CM | POA: Diagnosis not present

## 2023-03-19 DIAGNOSIS — F0153 Vascular dementia, unspecified severity, with mood disturbance: Secondary | ICD-10-CM | POA: Diagnosis not present

## 2023-03-19 DIAGNOSIS — M159 Polyosteoarthritis, unspecified: Secondary | ICD-10-CM | POA: Diagnosis not present

## 2023-03-23 DIAGNOSIS — M159 Polyosteoarthritis, unspecified: Secondary | ICD-10-CM | POA: Diagnosis not present

## 2023-03-23 DIAGNOSIS — F0153 Vascular dementia, unspecified severity, with mood disturbance: Secondary | ICD-10-CM | POA: Diagnosis not present

## 2023-03-25 DIAGNOSIS — M159 Polyosteoarthritis, unspecified: Secondary | ICD-10-CM | POA: Diagnosis not present

## 2023-03-30 DIAGNOSIS — M79675 Pain in left toe(s): Secondary | ICD-10-CM | POA: Diagnosis not present

## 2023-03-30 DIAGNOSIS — M79674 Pain in right toe(s): Secondary | ICD-10-CM | POA: Diagnosis not present

## 2023-03-30 DIAGNOSIS — B351 Tinea unguium: Secondary | ICD-10-CM | POA: Diagnosis not present

## 2023-04-22 DIAGNOSIS — M159 Polyosteoarthritis, unspecified: Secondary | ICD-10-CM | POA: Diagnosis not present

## 2023-04-22 DIAGNOSIS — F0153 Vascular dementia, unspecified severity, with mood disturbance: Secondary | ICD-10-CM | POA: Diagnosis not present

## 2023-05-01 DIAGNOSIS — M160 Bilateral primary osteoarthritis of hip: Secondary | ICD-10-CM | POA: Diagnosis not present

## 2023-05-01 DIAGNOSIS — G301 Alzheimer's disease with late onset: Secondary | ICD-10-CM | POA: Diagnosis not present

## 2023-06-24 DIAGNOSIS — M159 Polyosteoarthritis, unspecified: Secondary | ICD-10-CM | POA: Diagnosis not present

## 2023-06-30 DIAGNOSIS — Z79899 Other long term (current) drug therapy: Secondary | ICD-10-CM | POA: Diagnosis not present

## 2023-06-30 DIAGNOSIS — E559 Vitamin D deficiency, unspecified: Secondary | ICD-10-CM | POA: Diagnosis not present

## 2023-06-30 DIAGNOSIS — E785 Hyperlipidemia, unspecified: Secondary | ICD-10-CM | POA: Diagnosis not present

## 2023-07-06 DIAGNOSIS — M169 Osteoarthritis of hip, unspecified: Secondary | ICD-10-CM | POA: Diagnosis not present

## 2023-07-29 DIAGNOSIS — E559 Vitamin D deficiency, unspecified: Secondary | ICD-10-CM | POA: Diagnosis not present

## 2023-07-29 DIAGNOSIS — M159 Polyosteoarthritis, unspecified: Secondary | ICD-10-CM | POA: Diagnosis not present

## 2023-08-10 DIAGNOSIS — M169 Osteoarthritis of hip, unspecified: Secondary | ICD-10-CM | POA: Diagnosis not present

## 2023-08-26 DIAGNOSIS — E559 Vitamin D deficiency, unspecified: Secondary | ICD-10-CM | POA: Diagnosis not present

## 2023-08-26 DIAGNOSIS — M160 Bilateral primary osteoarthritis of hip: Secondary | ICD-10-CM | POA: Diagnosis not present

## 2023-08-26 DIAGNOSIS — G301 Alzheimer's disease with late onset: Secondary | ICD-10-CM | POA: Diagnosis not present

## 2023-08-31 DIAGNOSIS — M79675 Pain in left toe(s): Secondary | ICD-10-CM | POA: Diagnosis not present

## 2023-08-31 DIAGNOSIS — R079 Chest pain, unspecified: Secondary | ICD-10-CM | POA: Diagnosis not present

## 2023-08-31 DIAGNOSIS — I499 Cardiac arrhythmia, unspecified: Secondary | ICD-10-CM | POA: Diagnosis not present

## 2023-08-31 DIAGNOSIS — M79674 Pain in right toe(s): Secondary | ICD-10-CM | POA: Diagnosis not present

## 2023-08-31 DIAGNOSIS — B351 Tinea unguium: Secondary | ICD-10-CM | POA: Diagnosis not present

## 2023-09-01 DIAGNOSIS — R079 Chest pain, unspecified: Secondary | ICD-10-CM | POA: Diagnosis not present

## 2023-09-01 DIAGNOSIS — Z7401 Bed confinement status: Secondary | ICD-10-CM | POA: Diagnosis not present

## 2023-09-01 DIAGNOSIS — R531 Weakness: Secondary | ICD-10-CM | POA: Diagnosis not present

## 2023-09-02 DIAGNOSIS — R079 Chest pain, unspecified: Secondary | ICD-10-CM | POA: Diagnosis not present

## 2023-09-02 DIAGNOSIS — G301 Alzheimer's disease with late onset: Secondary | ICD-10-CM | POA: Diagnosis not present

## 2023-09-03 DIAGNOSIS — M169 Osteoarthritis of hip, unspecified: Secondary | ICD-10-CM | POA: Diagnosis not present

## 2023-09-30 DIAGNOSIS — M160 Bilateral primary osteoarthritis of hip: Secondary | ICD-10-CM | POA: Diagnosis not present

## 2023-09-30 DIAGNOSIS — G301 Alzheimer's disease with late onset: Secondary | ICD-10-CM | POA: Diagnosis not present

## 2023-10-06 DIAGNOSIS — M169 Osteoarthritis of hip, unspecified: Secondary | ICD-10-CM | POA: Diagnosis not present

## 2023-10-20 DIAGNOSIS — M159 Polyosteoarthritis, unspecified: Secondary | ICD-10-CM | POA: Diagnosis not present

## 2023-10-28 DIAGNOSIS — G301 Alzheimer's disease with late onset: Secondary | ICD-10-CM | POA: Diagnosis not present

## 2023-10-28 DIAGNOSIS — M159 Polyosteoarthritis, unspecified: Secondary | ICD-10-CM | POA: Diagnosis not present

## 2023-11-05 DIAGNOSIS — M169 Osteoarthritis of hip, unspecified: Secondary | ICD-10-CM | POA: Diagnosis not present

## 2023-11-25 DIAGNOSIS — G301 Alzheimer's disease with late onset: Secondary | ICD-10-CM | POA: Diagnosis not present

## 2023-11-25 DIAGNOSIS — R5381 Other malaise: Secondary | ICD-10-CM | POA: Diagnosis not present

## 2023-11-25 DIAGNOSIS — H5789 Other specified disorders of eye and adnexa: Secondary | ICD-10-CM | POA: Diagnosis not present

## 2023-12-10 DIAGNOSIS — G301 Alzheimer's disease with late onset: Secondary | ICD-10-CM | POA: Diagnosis not present

## 2023-12-10 DIAGNOSIS — M169 Osteoarthritis of hip, unspecified: Secondary | ICD-10-CM | POA: Diagnosis not present

## 2023-12-16 DIAGNOSIS — R600 Localized edema: Secondary | ICD-10-CM | POA: Diagnosis not present

## 2023-12-16 DIAGNOSIS — R051 Acute cough: Secondary | ICD-10-CM | POA: Diagnosis not present

## 2023-12-16 DIAGNOSIS — S60222A Contusion of left hand, initial encounter: Secondary | ICD-10-CM | POA: Diagnosis not present

## 2023-12-28 DIAGNOSIS — U071 COVID-19: Secondary | ICD-10-CM | POA: Diagnosis not present

## 2024-01-12 DIAGNOSIS — M169 Osteoarthritis of hip, unspecified: Secondary | ICD-10-CM | POA: Diagnosis not present

## 2024-01-12 DIAGNOSIS — G301 Alzheimer's disease with late onset: Secondary | ICD-10-CM | POA: Diagnosis not present

## 2024-01-20 DIAGNOSIS — E559 Vitamin D deficiency, unspecified: Secondary | ICD-10-CM | POA: Diagnosis not present

## 2024-01-20 DIAGNOSIS — M159 Polyosteoarthritis, unspecified: Secondary | ICD-10-CM | POA: Diagnosis not present

## 2024-01-20 DIAGNOSIS — G301 Alzheimer's disease with late onset: Secondary | ICD-10-CM | POA: Diagnosis not present

## 2024-01-22 DIAGNOSIS — M160 Bilateral primary osteoarthritis of hip: Secondary | ICD-10-CM | POA: Diagnosis not present

## 2024-01-22 DIAGNOSIS — E559 Vitamin D deficiency, unspecified: Secondary | ICD-10-CM | POA: Diagnosis not present

## 2024-01-22 DIAGNOSIS — G301 Alzheimer's disease with late onset: Secondary | ICD-10-CM | POA: Diagnosis not present

## 2024-01-22 DIAGNOSIS — M159 Polyosteoarthritis, unspecified: Secondary | ICD-10-CM | POA: Diagnosis not present

## 2024-01-27 DIAGNOSIS — E46 Unspecified protein-calorie malnutrition: Secondary | ICD-10-CM | POA: Diagnosis not present

## 2024-01-27 DIAGNOSIS — E559 Vitamin D deficiency, unspecified: Secondary | ICD-10-CM | POA: Diagnosis not present

## 2024-01-27 DIAGNOSIS — M159 Polyosteoarthritis, unspecified: Secondary | ICD-10-CM | POA: Diagnosis not present

## 2024-01-29 DIAGNOSIS — M159 Polyosteoarthritis, unspecified: Secondary | ICD-10-CM | POA: Diagnosis not present

## 2024-01-29 DIAGNOSIS — E46 Unspecified protein-calorie malnutrition: Secondary | ICD-10-CM | POA: Diagnosis not present

## 2024-02-01 DIAGNOSIS — E46 Unspecified protein-calorie malnutrition: Secondary | ICD-10-CM | POA: Diagnosis not present

## 2024-02-01 DIAGNOSIS — M159 Polyosteoarthritis, unspecified: Secondary | ICD-10-CM | POA: Diagnosis not present

## 2024-02-03 DIAGNOSIS — M159 Polyosteoarthritis, unspecified: Secondary | ICD-10-CM | POA: Diagnosis not present

## 2024-02-03 DIAGNOSIS — E46 Unspecified protein-calorie malnutrition: Secondary | ICD-10-CM | POA: Diagnosis not present

## 2024-02-05 DIAGNOSIS — M159 Polyosteoarthritis, unspecified: Secondary | ICD-10-CM | POA: Diagnosis not present

## 2024-02-05 DIAGNOSIS — E46 Unspecified protein-calorie malnutrition: Secondary | ICD-10-CM | POA: Diagnosis not present

## 2024-02-08 DIAGNOSIS — M159 Polyosteoarthritis, unspecified: Secondary | ICD-10-CM | POA: Diagnosis not present

## 2024-02-08 DIAGNOSIS — E46 Unspecified protein-calorie malnutrition: Secondary | ICD-10-CM | POA: Diagnosis not present

## 2024-02-15 DIAGNOSIS — G301 Alzheimer's disease with late onset: Secondary | ICD-10-CM | POA: Diagnosis not present

## 2024-02-17 DIAGNOSIS — E559 Vitamin D deficiency, unspecified: Secondary | ICD-10-CM | POA: Diagnosis not present

## 2024-02-17 DIAGNOSIS — D649 Anemia, unspecified: Secondary | ICD-10-CM | POA: Diagnosis not present

## 2024-02-17 DIAGNOSIS — E46 Unspecified protein-calorie malnutrition: Secondary | ICD-10-CM | POA: Diagnosis not present

## 2024-02-19 DIAGNOSIS — E46 Unspecified protein-calorie malnutrition: Secondary | ICD-10-CM | POA: Diagnosis not present

## 2024-02-19 DIAGNOSIS — E559 Vitamin D deficiency, unspecified: Secondary | ICD-10-CM | POA: Diagnosis not present

## 2024-03-10 DIAGNOSIS — I739 Peripheral vascular disease, unspecified: Secondary | ICD-10-CM | POA: Diagnosis not present

## 2024-03-10 DIAGNOSIS — L84 Corns and callosities: Secondary | ICD-10-CM | POA: Diagnosis not present

## 2024-03-10 DIAGNOSIS — L603 Nail dystrophy: Secondary | ICD-10-CM | POA: Diagnosis not present

## 2024-03-10 DIAGNOSIS — L602 Onychogryphosis: Secondary | ICD-10-CM | POA: Diagnosis not present

## 2024-03-14 DIAGNOSIS — G301 Alzheimer's disease with late onset: Secondary | ICD-10-CM | POA: Diagnosis not present

## 2024-03-23 DIAGNOSIS — E46 Unspecified protein-calorie malnutrition: Secondary | ICD-10-CM | POA: Diagnosis not present

## 2024-03-23 DIAGNOSIS — E559 Vitamin D deficiency, unspecified: Secondary | ICD-10-CM | POA: Diagnosis not present

## 2024-03-23 DIAGNOSIS — D649 Anemia, unspecified: Secondary | ICD-10-CM | POA: Diagnosis not present

## 2024-04-04 DIAGNOSIS — G301 Alzheimer's disease with late onset: Secondary | ICD-10-CM | POA: Diagnosis not present

## 2024-04-17 DEATH — deceased

## 2024-04-22 DIAGNOSIS — E46 Unspecified protein-calorie malnutrition: Secondary | ICD-10-CM | POA: Diagnosis not present

## 2024-04-22 DIAGNOSIS — E559 Vitamin D deficiency, unspecified: Secondary | ICD-10-CM | POA: Diagnosis not present

## 2024-05-02 DIAGNOSIS — G301 Alzheimer's disease with late onset: Secondary | ICD-10-CM | POA: Diagnosis not present

## 2024-05-10 DIAGNOSIS — I739 Peripheral vascular disease, unspecified: Secondary | ICD-10-CM | POA: Diagnosis not present

## 2024-05-10 DIAGNOSIS — L84 Corns and callosities: Secondary | ICD-10-CM | POA: Diagnosis not present

## 2024-05-10 DIAGNOSIS — L602 Onychogryphosis: Secondary | ICD-10-CM | POA: Diagnosis not present

## 2024-05-10 DIAGNOSIS — L603 Nail dystrophy: Secondary | ICD-10-CM | POA: Diagnosis not present

## 2024-05-11 DIAGNOSIS — E559 Vitamin D deficiency, unspecified: Secondary | ICD-10-CM | POA: Diagnosis not present

## 2024-05-11 DIAGNOSIS — E46 Unspecified protein-calorie malnutrition: Secondary | ICD-10-CM | POA: Diagnosis not present

## 2024-05-11 DIAGNOSIS — G301 Alzheimer's disease with late onset: Secondary | ICD-10-CM | POA: Diagnosis not present

## 2024-05-23 DIAGNOSIS — G301 Alzheimer's disease with late onset: Secondary | ICD-10-CM | POA: Diagnosis not present

## 2024-05-30 DIAGNOSIS — G301 Alzheimer's disease with late onset: Secondary | ICD-10-CM | POA: Diagnosis not present

## 2024-06-06 DIAGNOSIS — G301 Alzheimer's disease with late onset: Secondary | ICD-10-CM | POA: Diagnosis not present

## 2024-06-16 DIAGNOSIS — E559 Vitamin D deficiency, unspecified: Secondary | ICD-10-CM | POA: Diagnosis not present

## 2024-06-16 DIAGNOSIS — E46 Unspecified protein-calorie malnutrition: Secondary | ICD-10-CM | POA: Diagnosis not present

## 2024-06-16 DIAGNOSIS — G301 Alzheimer's disease with late onset: Secondary | ICD-10-CM | POA: Diagnosis not present

## 2024-06-20 DIAGNOSIS — G301 Alzheimer's disease with late onset: Secondary | ICD-10-CM | POA: Diagnosis not present

## 2024-06-22 DIAGNOSIS — R296 Repeated falls: Secondary | ICD-10-CM | POA: Diagnosis not present

## 2024-06-22 DIAGNOSIS — G301 Alzheimer's disease with late onset: Secondary | ICD-10-CM | POA: Diagnosis not present

## 2024-06-27 DIAGNOSIS — S61002A Unspecified open wound of left thumb without damage to nail, initial encounter: Secondary | ICD-10-CM | POA: Diagnosis not present

## 2024-06-27 DIAGNOSIS — G301 Alzheimer's disease with late onset: Secondary | ICD-10-CM | POA: Diagnosis not present

## 2024-07-04 DIAGNOSIS — G301 Alzheimer's disease with late onset: Secondary | ICD-10-CM | POA: Diagnosis not present

## 2024-07-04 DIAGNOSIS — S61002A Unspecified open wound of left thumb without damage to nail, initial encounter: Secondary | ICD-10-CM | POA: Diagnosis not present

## 2024-07-05 DIAGNOSIS — D649 Anemia, unspecified: Secondary | ICD-10-CM | POA: Diagnosis not present

## 2024-07-05 DIAGNOSIS — E46 Unspecified protein-calorie malnutrition: Secondary | ICD-10-CM | POA: Diagnosis not present

## 2024-07-06 DIAGNOSIS — M159 Polyosteoarthritis, unspecified: Secondary | ICD-10-CM | POA: Diagnosis not present

## 2024-07-06 DIAGNOSIS — E46 Unspecified protein-calorie malnutrition: Secondary | ICD-10-CM | POA: Diagnosis not present

## 2024-07-06 DIAGNOSIS — G301 Alzheimer's disease with late onset: Secondary | ICD-10-CM | POA: Diagnosis not present

## 2024-07-08 DIAGNOSIS — E46 Unspecified protein-calorie malnutrition: Secondary | ICD-10-CM | POA: Diagnosis not present

## 2024-07-08 DIAGNOSIS — G301 Alzheimer's disease with late onset: Secondary | ICD-10-CM | POA: Diagnosis not present

## 2024-07-08 DIAGNOSIS — E559 Vitamin D deficiency, unspecified: Secondary | ICD-10-CM | POA: Diagnosis not present

## 2024-07-11 DIAGNOSIS — S61002A Unspecified open wound of left thumb without damage to nail, initial encounter: Secondary | ICD-10-CM | POA: Diagnosis not present

## 2024-08-08 DIAGNOSIS — E559 Vitamin D deficiency, unspecified: Secondary | ICD-10-CM | POA: Diagnosis not present

## 2024-08-08 DIAGNOSIS — E46 Unspecified protein-calorie malnutrition: Secondary | ICD-10-CM | POA: Diagnosis not present

## 2024-08-08 DIAGNOSIS — G301 Alzheimer's disease with late onset: Secondary | ICD-10-CM | POA: Diagnosis not present

## 2024-09-05 DIAGNOSIS — E46 Unspecified protein-calorie malnutrition: Secondary | ICD-10-CM | POA: Diagnosis not present

## 2024-09-05 DIAGNOSIS — G301 Alzheimer's disease with late onset: Secondary | ICD-10-CM | POA: Diagnosis not present

## 2024-09-05 DIAGNOSIS — E559 Vitamin D deficiency, unspecified: Secondary | ICD-10-CM | POA: Diagnosis not present

## 2024-09-06 DIAGNOSIS — G301 Alzheimer's disease with late onset: Secondary | ICD-10-CM | POA: Diagnosis not present

## 2024-09-13 DIAGNOSIS — L603 Nail dystrophy: Secondary | ICD-10-CM | POA: Diagnosis not present

## 2024-09-13 DIAGNOSIS — L602 Onychogryphosis: Secondary | ICD-10-CM | POA: Diagnosis not present

## 2024-09-13 DIAGNOSIS — I739 Peripheral vascular disease, unspecified: Secondary | ICD-10-CM | POA: Diagnosis not present

## 2024-09-13 DIAGNOSIS — M21612 Bunion of left foot: Secondary | ICD-10-CM | POA: Diagnosis not present
